# Patient Record
Sex: Male | Born: 1990 | Race: White | Hispanic: No | Marital: Single | Smoking: Former smoker
Health system: Southern US, Community
[De-identification: ages and names within clinical notes are randomized; demographics above are authoritative.]

## PROBLEM LIST (undated history)

## (undated) HISTORY — PX: APPENDECTOMY: SHX54

## (undated) HISTORY — PX: FRACTURE SURGERY: SHX138

## (undated) HISTORY — PX: SURGERY SCROTAL / TESTICULAR: SUR1316

---
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
## 2013-08-26 ENCOUNTER — Ambulatory Visit: Payer: Self-pay | Admitting: Physician Assistant

## 2013-09-09 ENCOUNTER — Ambulatory Visit: Payer: Self-pay | Admitting: Podiatry

## 2014-02-05 ENCOUNTER — Ambulatory Visit: Payer: Self-pay | Admitting: Physician Assistant

## 2014-09-15 ENCOUNTER — Ambulatory Visit: Payer: Self-pay | Admitting: Family Medicine

## 2018-09-03 ENCOUNTER — Other Ambulatory Visit: Payer: Self-pay

## 2018-09-03 ENCOUNTER — Encounter: Payer: Self-pay | Admitting: Emergency Medicine

## 2018-09-03 ENCOUNTER — Ambulatory Visit
Admission: RE | Admit: 2018-09-03 | Discharge: 2018-09-03 | Disposition: A | Discharge status: Home / Self Care | Payer: Self-pay | Source: Ambulatory Visit | Attending: Family Medicine | Admitting: Family Medicine

## 2018-09-03 ENCOUNTER — Ambulatory Visit
Admission: EM | Admit: 2018-09-03 | Discharge: 2018-09-03 | Disposition: A | Discharge status: Home / Self Care | Payer: Self-pay | Attending: Family Medicine | Admitting: Family Medicine

## 2018-09-03 DIAGNOSIS — S0990XA Unspecified injury of head, initial encounter: Secondary | ICD-10-CM

## 2018-09-03 DIAGNOSIS — Z23 Encounter for immunization: Secondary | ICD-10-CM

## 2018-09-03 DIAGNOSIS — S01311A Laceration without foreign body of right ear, initial encounter: Secondary | ICD-10-CM

## 2018-09-03 MED ORDER — TETANUS-DIPHTH-ACELL PERTUSSIS 5-2.5-18.5 LF-MCG/0.5 IM SUSP
0.5000 mL | Freq: Once | INTRAMUSCULAR | Status: AC
  Administered 2018-09-03: 0.5 mL via INTRAMUSCULAR

## 2018-09-03 MED ORDER — AMOXICILLIN-POT CLAVULANATE 875-125 MG PO TABS: 1.0000 | ORAL_TABLET | Freq: Two times a day (BID) | ORAL | 0 refills | Status: DC

## 2018-09-03 NOTE — ED Triage Notes (Signed)
Patient denies LOC and states last tetanus was 2014.
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
## 2018-09-03 NOTE — ED Notes (Signed)
Called pt per Dr. Judd Gaudier and let patient know his CT results were negative. Patient understood and said he would finish the antibiotics.
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
## 2019-10-01 ENCOUNTER — Other Ambulatory Visit: Payer: Self-pay

## 2019-10-01 ENCOUNTER — Ambulatory Visit
Admission: EM | Admit: 2019-10-01 | Discharge: 2019-10-01 | Disposition: A | Discharge status: Home / Self Care | Payer: HRSA Program | Attending: Urgent Care | Admitting: Urgent Care

## 2019-10-01 ENCOUNTER — Encounter: Payer: Self-pay | Admitting: Emergency Medicine

## 2019-10-01 DIAGNOSIS — Z7189 Other specified counseling: Secondary | ICD-10-CM | POA: Diagnosis present

## 2019-10-01 DIAGNOSIS — J302 Other seasonal allergic rhinitis: Secondary | ICD-10-CM | POA: Diagnosis present

## 2019-10-01 DIAGNOSIS — Z20822 Contact with and (suspected) exposure to covid-19: Secondary | ICD-10-CM | POA: Diagnosis present

## 2019-10-01 LAB — SARS CORONAVIRUS 2 (TAT 6-24 HRS): SARS Coronavirus 2: NEGATIVE

## 2019-10-01 NOTE — Discharge Instructions (Signed)
It was very nice seeing you today in clinic. Thank you for entrusting me with your care.   Symptoms are mild. Agree that it is likely a change in the weather/allergies. However, with your exposure to COVID, it cannot be ruled out without testing. Recommending symptomatic care with rest, hydration, and over the counter allergy medication (Zyrtec or Claritin).   You were tested for SARS-CoV-2 (novel coronavirus) today. Testing is being processed at the main campus of [facility] in [place], and have been taking 12-24 hours to come back. Current recommendations from the the CDC and [place] DHHS require that you remain out of work in order to quarantine at home until negative test results are have been received. In the event that your test results are positive, you will be contacted with further directives. These measures are being implemented out of an abundance of caution to prevent transmission and spread during the current SARS-CoV-2 pandemic.  Make arrangements to follow up with your regular doctor in 1 week for re-evaluation if not improving.  If your symptoms/condition worsens, please seek follow up care either here or in the ER. Please remember, our [facility] providers are "right here with you" when you need [place].   Again, it was my pleasure to take care of you today. Thank you for choosing our clinic. I hope that you start to feel better quickly.   Quentin Mulling, MSN, APRN, FNP-C, CEN Advanced Practice Provider [facility]

## 2019-10-01 NOTE — ED Provider Notes (Signed)
Alec Gray   Name: Alec Gray DOB: [birth year] MRN: [id number] CSN: [id number] PCP: Patient, No Pcp Per  Arrival date and time:  10/01/19 1027  Chief Complaint:  Covid Exposure  NOTE: Prior to seeing the patient today, I have reviewed the triage nursing documentation and vital signs. Clinical staff has updated patient's PMH/PSHx, current medication list, and drug allergies/intolerances to ensure comprehensive history available to assist in medical decision making.   History:   HPI: Alec Gray is a 30 y.o. male who presents today with complaints of fatigue, scratchy throat, PND, and rhinorrhea that started last night. Patient denies fevers. He denies any cough, shortness of breath, or wheezing. He denies that he has experienced any nausea, vomiting, diarrhea, or abdominal pain. He is eating and drinking well. Patient denies any perceived alterations to his sense of taste or smell. Patient presents out of concerns for his personal health after being exposed to someone who tested positive for SARS-CoV-2 (novel coronavirus) on 09/26/2019. He has not been tested for SARS-CoV-2 (novel coronavirus) in the past 14 days; last tested negative in 07/2019 per his report. Patient has not been vaccinated for influenza this season. Despite his symptoms, patient has not taken any over the counter interventions to help improve/relieve his reported symptoms at home.   History reviewed. No pertinent past medical history.  Past Surgical History:  Procedure Laterality Date  . APPENDECTOMY    . FRACTURE SURGERY Right    wrist  . SURGERY SCROTAL / TESTICULAR      Family History  Problem Relation Age of Onset  . Diabetes Mother   . Esophageal cancer Father     Social History   Tobacco Use  . Smoking status: Current Every Day Smoker    Packs/day: 0.50    Types: Cigarettes  . Smokeless tobacco: Never Used  Substance Use Topics  . Alcohol use: Yes    Comment: occasional  . Drug  use: Never    There are no problems to display for this patient.   Home Medications:    No outpatient medications have been marked as taking for the 10/01/19 encounter [facility] Encounter).    Allergies:   Patient has no known allergies.  Review of Systems (ROS):  Review of systems NEGATIVE unless otherwise noted in narrative H&P section.   Vital Signs: Today's Vitals   10/01/19 1046 10/01/19 1048 10/01/19 1051  BP:   127/80  Pulse:   70  Resp:   18  Temp:   98.2 F (36.8 C)  TempSrc:   Oral  SpO2:   99%  Weight:  170 lb (77.1 kg)   Height:  6' (1.829 m)   PainSc: 4       Physical Exam: Physical Exam  Constitutional: He is oriented to person, place, and time and well-developed, well-nourished, and in no distress.  HENT:  Head: Normocephalic and atraumatic.  Right Ear: Tympanic membrane normal.  Left Ear: Tympanic membrane normal.  Nose: Rhinorrhea present. No sinus tenderness.  Mouth/Throat: Uvula is midline and mucous membranes are normal. Posterior oropharyngeal erythema (mild with (+) clear PND) present. No oropharyngeal exudate or posterior oropharyngeal edema.  Eyes: Pupils are equal, round, and reactive to light. Conjunctivae are normal.  Cardiovascular: Normal rate, regular rhythm, normal heart sounds and intact distal pulses.  Pulmonary/Chest: Effort normal and breath sounds normal.  No cough noted in clinic. No SOB or increased WOB. No distress. Able to speak in complete sentences without difficulties. SPO2 99% on RA.  Musculoskeletal:     Cervical back: Normal range of motion and neck supple.  Neurological: He is alert and oriented to person, place, and time. Gait normal.  Skin: Skin is warm and dry. No rash noted. He is not diaphoretic.  Psychiatric: Mood, memory, affect and judgment normal.  Nursing note and vitals reviewed.   Urgent Care Treatments / Results:   Orders Placed This Encounter  Procedures  . SARS CORONAVIRUS 2 (TAT 6-24 HRS)  Nasopharyngeal Nasopharyngeal Swab    LABS: PLEASE NOTE: all labs that were ordered this encounter are listed, however only abnormal results are displayed. Labs Reviewed  SARS CORONAVIRUS 2 (TAT 6-24 HRS)    EKG: -None  RADIOLOGY: No results found.  PROCEDURES: Procedures  MEDICATIONS RECEIVED THIS VISIT: Medications - No data to display  PERTINENT CLINICAL COURSE NOTES/UPDATES:   Initial Impression / Assessment and Plan / Urgent Care Course:  Pertinent labs & imaging results that were available during my care of the patient were personally reviewed by me and considered in my medical decision making (see lab/imaging section of note for values and interpretations).  Alec Gray is a 29 y.o. male who presents to [facility] today with complaints of Covid Exposure  Patient overall well appearing and in no acute distress today in clinic. Presenting symptoms (see HPI) and exam as documented above. He presents with symptoms associated with SARS-CoV-2 (novel coronavirus). Discussed typical symptom constellation. Reviewed potential for infection and need for testing. Patient amenable to being tested. SARS-CoV-2 swab collected by certified clinical staff. Discussed variable turn around times associated with testing, as swabs are being processed at the main campus of [facility] in [place], and have been taking 12-24 hours to come back. He was advised to self quarantine, per [facility] DHHS guidelines, until negative results received. These measures are being implemented out of an abundance of caution to prevent transmission and spread during the current SARS-CoV-2 pandemic.  Exam reassuring. Doubt SARS-CoV-2. Suspect symptoms related to seasonal allergies. Discussed with patient that, until ruled out with confirmatory lab testing, SARS-CoV-2 remains part of the differential. His testing is pending at this time. Patient encouraged to start OTC allergy medication (loratadine or  cetirizine). Discussed supportive care measures at home during acute phase of illness. Patient to rest as much as possible. He was encouraged to ensure adequate hydration. Recommended warm salt water gargles, hard candies/lozenges, and hot tea with honey/lemon to help soothe the throat and reduce irritation. May use Tylenol and/or Ibuprofen as needed for pain/fever.    Current clinical condition warrants patient being out of work in order to quarantine while waiting for testing results. He was provided with the appropriate documentation to provide to his place of employment that will allow for him to RTW on 10/03/2019 with no restrictions. RTW is contingent on his SARS-CoV-2 test results being reviewed as negative.     Discussed follow up with primary care physician in 1 week for re-evaluation. I have reviewed the follow up and strict return precautions for any new or worsening symptoms. Patient is aware of symptoms that would be deemed urgent/emergent, and would thus require further evaluation either here or in the emergency department. At the time of discharge, he verbalized understanding and consent with the discharge plan as it was reviewed with him. All questions were fielded by provider and/or clinic staff prior to patient discharge.    Final Clinical Impressions / Urgent Care Diagnoses:   Final diagnoses:  Exposure to COVID-19 virus  Seasonal  allergies  Encounter for laboratory testing for COVID-19 virus  Advice given about COVID-19 virus infection    New Prescriptions:  [place] Controlled Substance Registry consulted? Not Applicable  No orders of the defined types were placed in this encounter.   Recommended Follow up Care:  Patient encouraged to follow up with the following provider within the specified time frame, or sooner as dictated by the severity of his symptoms. As always, he was instructed that for any urgent/emergent care needs, he should seek care either here or in the emergency  department for more immediate evaluation.  Follow-up Information    PCP In 1 week.   Why: General reassessment of symptoms if not improving        NOTE: This note was prepared using Scientist, clinical (histocompatibility and immunogenetics) along with smaller Lobbyist. Despite my best ability to proofread, there is the potential that transcriptional errors may still occur from this process, and are completely unintentional.    Verlee Monte, NP 10/01/19 2231

## 2019-10-01 NOTE — ED Triage Notes (Signed)
Pt states he was exposed to covid about 5 days ago. He is c/o scratchy throat, fatigue, runny nose. Started last night. He feels his symptoms are related to the time of year but wanted needs to be sure for his job. He will need a note for work as well.
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
## 2022-10-31 ENCOUNTER — Ambulatory Visit
Admission: EM | Admit: 2022-10-31 | Discharge: 2022-10-31 | Disposition: A | Discharge status: Home / Self Care | Payer: Self-pay

## 2022-10-31 ENCOUNTER — Ambulatory Visit: Admit: 2022-10-31 | Payer: Self-pay

## 2022-10-31 DIAGNOSIS — J069 Acute upper respiratory infection, unspecified: Secondary | ICD-10-CM

## 2022-10-31 NOTE — ED Triage Notes (Signed)
Pt c/o cough,congestion & fatigue x3 days. Denies any fevers.

## 2022-10-31 NOTE — ED Provider Notes (Signed)
[facility]    CSN: [id number] Arrival date & time: 10/31/22  1110      History   Chief Complaint Chief Complaint  Patient presents with   Nasal Congestion   Cough   Fatigue         HPI Alec Gray is a 32 y.o. male.   Patient presents for evaluation of chills, body aches, nasal congestion, rhinorrhea, sore throat and a nonproductive cough present for 3 days.  No known sick contacts prior.  Tolerating food and liquids.  Has attempted use of Mucinex.  Denies shortness of breath, wheezing.    History reviewed. No pertinent past medical history.  There are no problems to display for this patient.   Past Surgical History:  Procedure Laterality Date   APPENDECTOMY     FRACTURE SURGERY Right    wrist   SURGERY SCROTAL / TESTICULAR         Home Medications    Prior to Admission medications   Not on File    Family History Family History  Problem Relation Age of Onset   Diabetes Mother    Esophageal cancer Father     Social History Social History   Tobacco Use   Smoking status: Every Day    Packs/day: .5    Types: Cigarettes   Smokeless tobacco: Never  Vaping Use   Vaping Use: Never used  Substance Use Topics   Alcohol use: Yes    Comment: occasional   Drug use: Never     Allergies   Patient has no known allergies.   Review of Systems Review of Systems  Constitutional:  Positive for chills. Negative for activity change, appetite change, diaphoresis, fatigue, fever and unexpected weight change.  HENT:  Positive for congestion, rhinorrhea and sore throat. Negative for dental problem, drooling, ear discharge, ear pain, facial swelling, hearing loss, mouth sores, nosebleeds, postnasal drip, sinus pressure, sinus pain, sneezing, tinnitus, trouble swallowing and voice change.   Respiratory:  Positive for cough. Negative for apnea, choking, chest tightness, shortness of breath, wheezing and stridor.   Cardiovascular: Negative.    Gastrointestinal: Negative.   Musculoskeletal:  Positive for myalgias. Negative for arthralgias, back pain, gait problem, joint swelling, neck pain and neck stiffness.     Physical Exam Triage Vital Signs ED Triage Vitals  Enc Vitals Group     BP 10/31/22 1158 107/76     Pulse Rate 10/31/22 1158 75     Resp 10/31/22 1158 16     Temp 10/31/22 1158 98.8 F (37.1 C)     Temp Source 10/31/22 1158 Oral     SpO2 10/31/22 1158 96 %     Weight 10/31/22 1200 185 lb (83.9 kg)     Height 10/31/22 1200 6' (1.829 m)     Head Circumference --      Peak Flow --      Pain Score 10/31/22 1200 0     Pain Loc --      Pain Edu? --      Excl. in GC? --    No data found.  Updated Vital Signs BP 107/76 (BP Location: Left Arm)   Pulse 75   Temp 98.8 F (37.1 C) (Oral)   Resp 16   Ht 6' (1.829 m)   Wt 185 lb (83.9 kg)   SpO2 96%   BMI 25.09 kg/m   Visual Acuity Right Eye Distance:   Left Eye Distance:   Bilateral Distance:    Right  Eye Near:   Left Eye Near:    Bilateral Near:     Physical Exam Constitutional:      Appearance: Normal appearance.  HENT:     Head: Normocephalic.     Right Ear: Tympanic membrane, ear canal and external ear normal.     Left Ear: Tympanic membrane, ear canal and external ear normal.     Nose: Congestion and rhinorrhea present.     Mouth/Throat:     Mouth: Mucous membranes are moist.     Pharynx: Posterior oropharyngeal erythema present.  Eyes:     Extraocular Movements: Extraocular movements intact.  Cardiovascular:     Rate and Rhythm: Normal rate and regular rhythm.     Pulses: Normal pulses.     Heart sounds: Normal heart sounds.  Pulmonary:     Effort: Pulmonary effort is normal.     Breath sounds: Normal breath sounds.  Skin:    General: Skin is warm and dry.  Neurological:     Mental Status: He is alert and oriented to person, place, and time. Mental status is at baseline.  Psychiatric:        Mood and Affect: Mood normal.         Behavior: Behavior normal.      UC Treatments / Results  Labs (all labs ordered are listed, but only abnormal results are displayed) Labs Reviewed - No data to display  EKG   Radiology No results found.  Procedures Procedures (including critical care time)  Medications Ordered in UC Medications - No data to display  Initial Impression / Assessment and Plan / UC Course  I have reviewed the triage vital signs and the nursing notes.  Pertinent labs & imaging results that were available during my care of the patient were reviewed by me and considered in my medical decision making (see chart for details).  Viral URI with cough  Patient is in no signs of distress nor toxic appearing.  Vital signs are stable.  Low suspicion for pneumonia, pneumothorax or bronchitis and therefore will defer imaging.  Declined viral and strep testing.May use additional over-the-counter medications as needed for supportive care.  May follow-up with urgent care as needed if symptoms persist or worsen.  Note given.   Final Clinical Impressions(s) / UC Diagnoses   Final diagnoses:  None   Discharge Instructions   None    ED Prescriptions   None    PDMP not reviewed this encounter.   Valinda Hoar, NP 10/31/22 1230

## 2022-10-31 NOTE — Discharge Instructions (Signed)

## 2023-04-17 ENCOUNTER — Ambulatory Visit
Admission: EM | Admit: 2023-04-17 | Discharge: 2023-04-17 | Disposition: A | Discharge status: Home / Self Care | Payer: Self-pay | Attending: Emergency Medicine | Admitting: Emergency Medicine

## 2023-04-17 ENCOUNTER — Encounter: Payer: Self-pay | Admitting: Emergency Medicine

## 2023-04-17 DIAGNOSIS — Z20822 Contact with and (suspected) exposure to covid-19: Secondary | ICD-10-CM

## 2023-04-17 DIAGNOSIS — J0100 Acute maxillary sinusitis, unspecified: Secondary | ICD-10-CM

## 2023-04-17 DIAGNOSIS — J029 Acute pharyngitis, unspecified: Secondary | ICD-10-CM

## 2023-04-17 DIAGNOSIS — J069 Acute upper respiratory infection, unspecified: Secondary | ICD-10-CM

## 2023-04-17 LAB — GROUP A STREP BY PCR: Group A Strep by PCR: NOT DETECTED

## 2023-04-17 LAB — SARS CORONAVIRUS 2 BY RT PCR: SARS Coronavirus 2 by RT PCR: NEGATIVE

## 2023-04-17 MED ORDER — NAPROXEN 500 MG PO TABS: 500.0000 mg | ORAL_TABLET | Freq: Two times a day (BID) | ORAL | 0 refills | Status: AC

## 2023-04-17 MED ORDER — FLUTICASONE PROPIONATE 50 MCG/ACT NA SUSP: 2.0000 | Freq: Every day | NASAL | 0 refills | Status: AC

## 2023-04-17 NOTE — ED Provider Notes (Signed)
HPI  SUBJECTIVE:  Patient reports sore throat starting 3 days ago. Sx worse with swallowing.  Sx better with ibuprofen at 1000 mg.  Family member reports seeing "white spots" in his throat. Has felt feverish, but has no documented temperature.   No neck stiffness  No Cough, wheezing + nasal congestion, rhinorrhea-but states this has not changed, No sinus pain or pressure, postnasal drip No facial swelling, upper dental pain No Myalgias + Headache No Rash  No loss of taste or smell No shortness of breath or difficulty breathing No nausea, vomiting No diarrhea No abdominal pain     No Recent mono, flu, COVID exposure + Questionable strep exposure No reflux sxs No Allergy sxs  No Breathing difficulty, voice changes, sensation of throat swelling shut No Drooling No Trismus No abx in past month.  Did not get the COVID or flu vaccines No antipyretic in past 6 hrs  He has no past medical history PCP: None   History reviewed. No pertinent past medical history.  Past Surgical History:  Procedure Laterality Date   APPENDECTOMY     FRACTURE SURGERY Right    wrist   SURGERY SCROTAL / TESTICULAR      Family History  Problem Relation Age of Onset   Diabetes Mother    Esophageal cancer Father     Social History   Tobacco Use   Smoking status: Former    Current packs/day: 0.50    Types: Cigarettes   Smokeless tobacco: Never  Vaping Use   Vaping status: Every Day  Substance Use Topics   Alcohol use: Yes    Comment: occasional   Drug use: Never    No current facility-administered medications for this encounter.  Current Outpatient Medications:    fluticasone (FLONASE) 50 MCG/ACT nasal spray, Place 2 sprays into both nostrils daily., Disp: 16 g, Rfl: 0   naproxen (NAPROSYN) 500 MG tablet, Take 1 tablet (500 mg total) by mouth 2 (two) times daily., Disp: 20 tablet, Rfl: 0  No Known Allergies   ROS  As noted in HPI.   Physical Exam  BP 113/80 (BP  Location: Left Arm)   Pulse 73   Temp 98.2 F (36.8 C) (Oral)   Resp 16   SpO2 100%   Constitutional: Well developed, well nourished, no acute distress Eyes:  EOMI, conjunctiva normal bilaterally HENT: Normocephalic, atraumatic,mucus membranes moist.  Positive nasal congestion, erythematous, swollen turbinates.  Left maxillary sinus tenderness.  Erythematous oropharynx, slightly enlarged tonsils without exudates.  Positive cobblestoning.  No obvious postnasal drip.Marland Kitchen  Respiratory: Normal inspiratory effort Cardiovascular: Normal rate, no murmurs, rubs, gallops GI: nondistended, nontender. No appreciable splenomegaly skin: No rash, skin intact Lymph: + Anterior cervical LN.  No posterior cervical lymphadenopathy Musculoskeletal: no deformities Neurologic: Alert & oriented x 3, no focal neuro deficits Psychiatric: Speech and behavior appropriate.   ED Course   Medications - No data to display  Orders Placed This Encounter  Procedures   SARS Coronavirus 2 by RT PCR (hospital order, performed in [facility] hospital lab) *cepheid single result test* Anterior Nasal Swab    Standing Status:   Standing    Number of Occurrences:   1   Group A Strep by PCR    Standing Status:   Standing    Number of Occurrences:   1    Results for orders placed or performed during the hospital encounter of 04/17/23 (from the past 24 hour(s))  SARS Coronavirus 2 by RT PCR (hospital order, performed  in [facility] hospital lab) *cepheid single result test* Anterior Nasal Swab     Status: None   Collection Time: 04/17/23  6:17 PM   Specimen: Anterior Nasal Swab  Result Value Ref Range   SARS Coronavirus 2 by RT PCR NEGATIVE NEGATIVE  Group A Strep by PCR     Status: None   Collection Time: 04/17/23  6:17 PM   Specimen: Throat; Sterile Swab  Result Value Ref Range   Group A Strep by PCR NOT DETECTED NOT DETECTED   No results found.  ED Clinical Impression  1. Acute non-recurrent maxillary  sinusitis   2. Upper respiratory tract infection, unspecified type   3. Sore throat   4. Encounter for laboratory testing for COVID-19 virus      ED Assessment/Plan    COVID, strep PCR negative.  Patient has left maxillary sinus tenderness, but I suspect a viral sinusitis at this time.  He does not meet ISDA criteria for antibiotics.  Discussed this with patient and family member.  Home with Mucinex D, Flonase, saline nasal irrigation, Naprosyn/Tylenol, Benadryl/Maalox mixture.  Work note for 2 days.  Return here in 6 days if not better or sooner for double sickening and we can consider antibiotics at that time.  ER return precautions given.  Will provide primary care list for routine care.  Discussed labs,  MDM, plan and followup with patient and family member discussed sn/sx that should prompt return to the ED. they agree with plan.   Meds ordered this encounter  Medications   fluticasone (FLONASE) 50 MCG/ACT nasal spray    Sig: Place 2 sprays into both nostrils daily.    Dispense:  16 g    Refill:  0   naproxen (NAPROSYN) 500 MG tablet    Sig: Take 1 tablet (500 mg total) by mouth 2 (two) times daily.    Dispense:  20 tablet    Refill:  0     *This clinic note was created using Scientist, clinical (histocompatibility and immunogenetics). Therefore, there may be occasional mistakes despite careful proofreading.       Domenick Gong, MD 04/18/23 [phone]

## 2023-04-17 NOTE — ED Triage Notes (Signed)
Pt presents with a fever, cough, sore throat and bodyaches x 2 days.

## 2023-04-17 NOTE — Discharge Instructions (Addendum)
COVID, flu PCR is negative.  Mucinex D extra strength twice a day, Flonase, saline nasal irrigation with a NeilMed rinse and distilled water as often as you want, Naprosyn combined with with 1000 mg of Tylenol twice a day.    Some people find salt water gargles and  Traditional Medicinal's "Throat Coat" tea helpful. Take 5 mL of liquid Benadryl and 5 mL of Maalox. Mix it together, and then hold it in your mouth for as long as you can and then swallow. You may do this 4 times a day.  Honey and lemon dissolved in hot water can also be soothing.     Return here in 6 days if not better or sooner for double sickening and we can consider antibiotics at that time.  Here is a list of primary care providers who are taking new patients:  [facility] [place] Dr. Joseph Berkshire (sports medicine) Dr. Elizabeth Sauer [address] [place] [address] [phone]  [facility] [address] [place], [place] [address] [phone]  [facility] [place] [address]  [place] [address]  [phone]  [facility] [address] [place], [place] [address] [phone]  [facility] [address] [place]  [phone] [place], [place] [address]  Here are clinics/ other resources who will see you if you do not have insurance. Some have certain criteria that you must meet. Call them and find out what they are:  [facility]: [address] [place], [place] [address] Phone: [phone] Hours: First and Third Saturdays of each Month, 9 a.m. - 1 p.m.  [facility]: [address], [address] [place], [place] [address] Phone: [phone] Hours: Tuesday, 4 p.m. - 8 p.m. Thursday, 1 p.m. - 8 p.m. Wednesday, 9 a.m. - [facility] [address] [place], [place] [address] Phone: [phone] Pharmacy Phone Number: [phone] Dental Phone Number: [phone] [facility] Insurance Help: [phone]  Dental Hours: Monday -  Thursday, 8 a.m. - 6 p.m.  [facility] [address] [place], [place] [address] Phone: [phone] Pharmacy Phone Number: [phone] [facility] Insurance Help: [phone]  [facility] [address] [place] [place], [place] [address] Phone: [phone] Pharmacy Phone Number: [phone] [facility] Insurance Help: [phone]  [facility] [address] [place], [place] [address] Phone: [phone] [facility] Insurance Help: [phone]   [facility] [address], [place], [place] [address] Phone: [phone]  Go to www.goodrx.com  or www.costplusdrugs.com to look up your medications. This will give you a list of where you can find your prescriptions at the most affordable prices. Or ask the pharmacist what the cash price is, or if they have any other discount programs available to help make your medication more affordable. This can be less expensive than what you would pay with insurance.

## 2023-07-20 ENCOUNTER — Emergency Department
Admission: EM | Admit: 2023-07-20 | Discharge: 2023-07-20 | Disposition: A | Discharge status: Home / Self Care | Payer: Self-pay | Attending: Emergency Medicine | Admitting: Emergency Medicine

## 2023-07-20 ENCOUNTER — Emergency Department: Payer: Self-pay

## 2023-07-20 ENCOUNTER — Encounter: Payer: Self-pay | Admitting: Emergency Medicine

## 2023-07-20 ENCOUNTER — Other Ambulatory Visit: Payer: Self-pay

## 2023-07-20 DIAGNOSIS — B349 Viral infection, unspecified: Secondary | ICD-10-CM | POA: Insufficient documentation

## 2023-07-20 DIAGNOSIS — L209 Atopic dermatitis, unspecified: Secondary | ICD-10-CM

## 2023-07-20 DIAGNOSIS — Z20822 Contact with and (suspected) exposure to covid-19: Secondary | ICD-10-CM | POA: Insufficient documentation

## 2023-07-20 LAB — RESP PANEL BY RT-PCR (RSV, FLU A&B, COVID)  RVPGX2
Influenza A by PCR: NEGATIVE
Influenza B by PCR: NEGATIVE
Resp Syncytial Virus by PCR: NEGATIVE
SARS Coronavirus 2 by RT PCR: NEGATIVE

## 2023-07-20 MED ORDER — HYDROCORTISONE 2.5 % EX OINT: TOPICAL_OINTMENT | Freq: Two times a day (BID) | CUTANEOUS | 0 refills | Status: AC

## 2023-07-20 MED ORDER — PSEUDOEPH-BROMPHEN-DM 30-2-10 MG/5ML PO SYRP: 10.0000 mL | ORAL_SOLUTION | Freq: Four times a day (QID) | ORAL | 0 refills | Status: DC | PRN

## 2023-07-20 NOTE — Discharge Instructions (Addendum)
 Stay well-hydrated.  Take Tylenol and ibuprofen for body aches, fever, sore throat.  Follow-up with primary care if not improving over the week

## 2023-07-20 NOTE — ED Provider Notes (Signed)
 [facility] Provider Note    Event Date/Time   First MD Initiated Contact with Patient 07/20/23 [phone]     (approximate)   History   Cough   HPI  Alec Gray is a 33 y.o. male  with no significant past medical history and as listed in EMR presents to the emergency department for treatment and evaluation of sinus drainage, sinus pressure, cough, sore throat, headache, and bodyaches.  Symptoms started about a week ago.  Significant other here with similar symptoms.  No relief with over-the-counter medications.  Patient also complaining of a recurrent skin rash that occurs in areas around axilla and groin that seems to worsen when he works.       Physical Exam   Triage Vital Signs: ED Triage Vitals  Encounter Vitals Group     BP 07/20/23 0653 130/87     Systolic BP Percentile --      Diastolic BP Percentile --      Pulse Rate 07/20/23 0653 63     Resp 07/20/23 0653 16     Temp 07/20/23 0653 97.7 F (36.5 C)     Temp Source 07/20/23 0653 Oral     SpO2 07/20/23 0653 98 %     Weight 07/20/23 0644 190 lb (86.2 kg)     Height 07/20/23 0644 6' (1.829 m)     Head Circumference --      Peak Flow --      Pain Score 07/20/23 0644 6     Pain Loc --      Pain Education --      Exclude from Growth Chart --     Most recent vital signs: Vitals:   07/20/23 0653  BP: 130/87  Pulse: 63  Resp: 16  Temp: 97.7 F (36.5 C)  SpO2: 98%    General: Awake, no distress.  CV:  Good peripheral perfusion.  Resp:  Normal effort.  Breath sounds clear to auscultation Abd:  No distention.  Other:  Rough textured rash, mildly erythematous bilateral posterior axilla.   ED Results / Procedures / Treatments   Labs (all labs ordered are listed, but only abnormal results are displayed) Labs Reviewed  RESP PANEL BY RT-PCR (RSV, FLU A&B, COVID)  [id number]     EKG  Not indicated.   RADIOLOGY  Image and radiology report reviewed and interpreted by me.  Radiology report consistent with the same.  Not indicated.  PROCEDURES:  Critical Care performed: No  Procedures   MEDICATIONS ORDERED IN ED:  Medications - No data to display   IMPRESSION / MDM / ASSESSMENT AND PLAN / ED COURSE   I have reviewed the triage note.  Differential diagnosis includes, but is not limited to, Influenza, Covid, RSV, viral syndrome  Patient's presentation is most consistent with acute illness / injury with system symptoms.  33 year old male presenting to the emergency department for 1 week history of viral symptoms as described in the HPI.  Vital signs are stable.  Exam is reassuring.  Chest x-ray is negative for acute cardiopulmonary abnormality.  Viral panel is pending.  Significant other who is also patient is positive for influenza A.  Plan will be to treat him with Bromfed and have him continue Tylenol and ibuprofen.  Work excuse provided for the next few days as well.  He is to see primary care or return to the emergency department if symptoms or not improving over the week.      FINAL CLINICAL  IMPRESSION(S) / ED DIAGNOSES   Final diagnoses:  Acute viral syndrome     Rx / DC Orders   ED Discharge Orders          Ordered    brompheniramine-pseudoephedrine-DM 30-2-10 MG/5ML syrup  4 times daily PRN        07/20/23 0815             Note:  This document was prepared using Dragon voice recognition software and may include unintentional dictation errors.   Herlinda Kirk NOVAK, FNP 07/20/23 0840    Levander Slate, MD 07/20/23 [phone]

## 2023-07-20 NOTE — ED Triage Notes (Signed)
 Patient ambulatory to triage with steady gait, without difficulty or distress noted; pt reports x wk having sinus drainage/pressure, prod cough clear, sometimes green mucous

## 2023-11-17 ENCOUNTER — Ambulatory Visit
Admission: RE | Admit: 2023-11-17 | Discharge: 2023-11-17 | Disposition: A | Discharge status: Home / Self Care | Payer: Self-pay | Source: Ambulatory Visit | Attending: Family Medicine | Admitting: Family Medicine

## 2023-11-17 VITALS — BP 118/78 | HR 75 | Temp 97.8°F | Ht 72.0 in | Wt 190.0 lb

## 2023-11-17 DIAGNOSIS — R21 Rash and other nonspecific skin eruption: Secondary | ICD-10-CM

## 2023-11-17 MED ORDER — FLUCONAZOLE 150 MG PO TABS: 150.0000 mg | ORAL_TABLET | ORAL | 0 refills | Status: AC

## 2023-11-17 MED ORDER — NYSTATIN-TRIAMCINOLONE 100000-0.1 UNIT/GM-% EX OINT: 1.0000 | TOPICAL_OINTMENT | Freq: Two times a day (BID) | CUTANEOUS | 0 refills | Status: AC

## 2023-11-17 NOTE — ED Triage Notes (Signed)
 Pt c/o rash under arms and along inner thighs x3years  Pt is worried about possible Eczema

## 2023-11-17 NOTE — ED Provider Notes (Signed)
 [facility]    CSN: [id number] Arrival date & time: 11/17/23  1104      History   Chief Complaint Chief Complaint  Patient presents with   Rash    HPI Alec Gray is a 33 y.o. male.   HPI  Alec Gray presents for rash under his arm. Symptoms started a few years back and told it was fungal but he is concerned that he has eczema. Symptoms returned about 6 days ago.  Applied nothing to it thus far. Has rash around his groin also. He works outside and sweats a lot. .     There is been no new products including soaps and detergents.  No eye irritation, sore throat, difficulty breathing, nausea, vomiting or diarrhea.  Denies belly pain, joint pain and fever.  There has been no medication changes or new supplements.  Denies any new foods or drinks.      History reviewed. No pertinent past medical history.  There are no active problems to display for this patient.   Past Surgical History:  Procedure Laterality Date   APPENDECTOMY     FRACTURE SURGERY Right    wrist   SURGERY SCROTAL / TESTICULAR         Home Medications    Prior to Admission medications   Medication Sig Start Date End Date Taking? Authorizing Provider  fluconazole (DIFLUCAN) 150 MG tablet Take 1 tablet (150 mg total) by mouth once a week. 11/17/23  Yes Rieley Khalsa, DO  nystatin-triamcinolone ointment (MYCOLOG) Apply 1 Application topically 2 (two) times daily. 11/17/23  Yes Vaidehi Braddy, DO  fluticasone  (FLONASE ) 50 MCG/ACT nasal spray Place 2 sprays into both nostrils daily. 04/17/23   Ethlyn Herd, MD  hydrocortisone  2.5 % ointment Apply topically 2 (two) times daily. 07/20/23   Triplett, Davene Ernst B, FNP  naproxen  (NAPROSYN ) 500 MG tablet Take 1 tablet (500 mg total) by mouth 2 (two) times daily. 04/17/23   Ethlyn Herd, MD    Family History Family History  Problem Relation Age of Onset   Diabetes Mother    Esophageal cancer Father     Social History Social History    Tobacco Use   Smoking status: Former    Current packs/day: 0.50    Types: Cigarettes   Smokeless tobacco: Never  Vaping Use   Vaping status: Every Day  Substance Use Topics   Alcohol use: Yes    Comment: occasional   Drug use: Never     Allergies   Patient has no known allergies.   Review of Systems Review of Systems :negative unless otherwise stated in HPI.      Physical Exam Triage Vital Signs ED Triage Vitals  Encounter Vitals Group     BP 11/17/23 1110 118/78     Systolic BP Percentile --      Diastolic BP Percentile --      Pulse Rate 11/17/23 1110 75     Resp --      Temp 11/17/23 1110 97.8 F (36.6 C)     Temp Source 11/17/23 1110 Oral     SpO2 11/17/23 1110 97 %     Weight 11/17/23 1112 190 lb (86.2 kg)     Height 11/17/23 1112 6' (1.829 m)     Head Circumference --      Peak Flow --      Pain Score 11/17/23 1111 6     Pain Loc --      Pain Education --  Exclude from Growth Chart --    No data found.  Updated Vital Signs BP 118/78 (BP Location: Left Arm)   Pulse 75   Temp 97.8 F (36.6 C) (Oral)   Ht 6' (1.829 m)   Wt 86.2 kg   SpO2 97%   BMI 25.77 kg/m   Visual Acuity Right Eye Distance:   Left Eye Distance:   Bilateral Distance:    Right Eye Near:   Left Eye Near:    Bilateral Near:     Physical Exam  GEN: alert, well appearing male, in no acute distress  RESP: no increased work of breathing, NEURO: alert, moves all extremities appropriately SKIN: warm and dry; erythematous patches with scaling on posterior axilla, left groin with similar erythematous patches with scaling and satellite lesions     UC Treatments / Results  Labs (all labs ordered are listed, but only abnormal results are displayed) Labs Reviewed - No data to display  EKG   Radiology No results found.  Procedures Procedures (including critical care time)  Medications Ordered in UC Medications - No data to display  Initial Impression /  Assessment and Plan / UC Course  I have reviewed the triage vital signs and the nursing notes.  Pertinent labs & imaging results that were available during my care of the patient were reviewed by me and considered in my medical decision making (see chart for details).     Patient is a 33 y.o. male who presents for rash.  Overall, patient is well-appearing and well-hydrated.  Vital signs stable.  Alec Gray is afebrile.  Exam concerning for fungal infection.  Treat with Diflucan weekly for 4 weeks and Mycolog ointment. Pt to make sure he is using an deodorant with an antiperspirant. No sign of bacterial infection to suggest antibiotics at this time.  Not likely viral exanthem.    Reviewed expectations regarding course of current medical issues.  All questions asked were answered.  Outlined signs and symptoms indicating need for more acute intervention. Patient verbalized understanding. After Visit Summary given.   Final Clinical Impressions(s) / UC Diagnoses   Final diagnoses:  Rash     Discharge Instructions      Stop by the pharmacy to pick up your prescriptions.  Follow up with your primary care provider or return to the urgent care, if not improving.       ED Prescriptions     Medication Sig Dispense Auth. Provider   nystatin-triamcinolone ointment (MYCOLOG) Apply 1 Application topically 2 (two) times daily. 30 g Aalyssa Elderkin, DO   fluconazole (DIFLUCAN) 150 MG tablet Take 1 tablet (150 mg total) by mouth once a week. 4 tablet Marielis Samara, DO      PDMP not reviewed this encounter.              Fidel Huddle, DO 11/17/23 1142

## 2023-11-17 NOTE — Discharge Instructions (Signed)
 Stop by the pharmacy to pick up your prescriptions.  Follow up with your primary care provider or return to the urgent care, if not improving.

## 2024-04-19 ENCOUNTER — Ambulatory Visit
Admission: EM | Admit: 2024-04-19 | Discharge: 2024-04-19 | Disposition: A | Discharge status: Home / Self Care | Payer: Self-pay | Attending: Emergency Medicine | Admitting: Emergency Medicine

## 2024-04-19 DIAGNOSIS — J069 Acute upper respiratory infection, unspecified: Secondary | ICD-10-CM

## 2024-04-19 MED ORDER — BENZONATATE 100 MG PO CAPS: 200.0000 mg | ORAL_CAPSULE | Freq: Three times a day (TID) | ORAL | 0 refills | Status: AC

## 2024-04-19 MED ORDER — IPRATROPIUM BROMIDE 0.06 % NA SOLN: 2.0000 | Freq: Four times a day (QID) | NASAL | 12 refills | Status: AC

## 2024-04-19 MED ORDER — PROMETHAZINE-DM 6.25-15 MG/5ML PO SYRP: 5.0000 mL | ORAL_SOLUTION | Freq: Four times a day (QID) | ORAL | 0 refills | Status: AC | PRN

## 2024-04-19 NOTE — ED Triage Notes (Signed)
 Patient to Urgent Care with complaints of productive cough/ green nasal congestion. Denies any known fevers.   Symptoms x3-4 days.   Using mucinex  Requests a doctor's note.

## 2024-04-19 NOTE — ED Provider Notes (Signed)
 [facility]    CSN: [id number] Arrival date & time: 04/19/24  1514      History   Chief Complaint Chief Complaint  Patient presents with   Nasal Congestion    HPI Alec Gray is a 33 y.o. male.   HPI  33 year old male with no significant past medical history presents for evaluation of 4 days worth of respiratory symptoms to include nasal congestion with green nasal discharge, ear pain, sore throat, productive cough, short of breath, and wheezing.  He denies any fever.  No known sick contacts.  No travel out of state or country.  History reviewed. No pertinent past medical history.  There are no active problems to display for this patient.   Past Surgical History:  Procedure Laterality Date   APPENDECTOMY     FRACTURE SURGERY Right    wrist   SURGERY SCROTAL / TESTICULAR         Home Medications    Prior to Admission medications   Medication Sig Start Date End Date Taking? Authorizing Provider  benzonatate (TESSALON) 100 MG capsule Take 2 capsules (200 mg total) by mouth every 8 (eight) hours. 04/19/24  Yes Bernardino Ditch, NP  ipratropium (ATROVENT) 0.06 % nasal spray Place 2 sprays into both nostrils 4 (four) times daily. 04/19/24  Yes Bernardino Ditch, NP  promethazine-dextromethorphan (PROMETHAZINE-DM) 6.25-15 MG/5ML syrup Take 5 mLs by mouth 4 (four) times daily as needed. 04/19/24  Yes Bernardino Ditch, NP  fluconazole  (DIFLUCAN ) 150 MG tablet Take 1 tablet (150 mg total) by mouth once a week. 11/17/23   Brimage, Vondra, DO  fluticasone  (FLONASE ) 50 MCG/ACT nasal spray Place 2 sprays into both nostrils daily. 04/17/23   Van Knee, MD  hydrocortisone  2.5 % ointment Apply topically 2 (two) times daily. 07/20/23   Triplett, Kirk B, FNP  naproxen  (NAPROSYN ) 500 MG tablet Take 1 tablet (500 mg total) by mouth 2 (two) times daily. 04/17/23   Van Knee, MD  nystatin -triamcinolone  ointment (MYCOLOG) Apply 1 Application topically 2 (two) times daily.  11/17/23   Brimage, Vondra, DO    Family History Family History  Problem Relation Age of Onset   Diabetes Mother    Esophageal cancer Father     Social History Social History   Tobacco Use   Smoking status: Former    Current packs/day: 0.50    Types: Cigarettes   Smokeless tobacco: Never  Vaping Use   Vaping status: Every Day  Substance Use Topics   Alcohol use: Yes    Comment: occasional   Drug use: Never     Allergies   Patient has no known allergies.   Review of Systems Review of Systems  Constitutional:  Negative for fever.  HENT:  Positive for congestion, ear pain, rhinorrhea and sore throat.   Respiratory:  Positive for cough, shortness of breath and wheezing.      Physical Exam Triage Vital Signs ED Triage Vitals  Encounter Vitals Group     BP 04/19/24 1611 121/79     Girls Systolic BP Percentile --      Girls Diastolic BP Percentile --      Boys Systolic BP Percentile --      Boys Diastolic BP Percentile --      Pulse Rate 04/19/24 1611 70     Resp 04/19/24 1611 18     Temp 04/19/24 1611 98.2 F (36.8 C)     Temp src --      SpO2 04/19/24 1611 100 %  Weight --      Height --      Head Circumference --      Peak Flow --      Pain Score 04/19/24 1610 3     Pain Loc --      Pain Education --      Exclude from Growth Chart --    No data found.  Updated Vital Signs BP 121/79   Pulse 70   Temp 98.2 F (36.8 C)   Resp 18   SpO2 100%   Visual Acuity Right Eye Distance:   Left Eye Distance:   Bilateral Distance:    Right Eye Near:   Left Eye Near:    Bilateral Near:     Physical Exam Vitals and nursing note reviewed.  Constitutional:      Appearance: Normal appearance. He is not ill-appearing.  HENT:     Head: Normocephalic and atraumatic.     Right Ear: Tympanic membrane, ear canal and external ear normal. There is no impacted cerumen.     Left Ear: Tympanic membrane, ear canal and external ear normal. There is no impacted  cerumen.     Nose: Congestion and rhinorrhea present.     Comments: Mucosa is edematous and erythematous with scant clear discharge in both nares.    Mouth/Throat:     Mouth: Mucous membranes are moist.     Pharynx: Oropharynx is clear. Posterior oropharyngeal erythema present. No oropharyngeal exudate.     Comments: Tonsillar pillars are unremarkable.  Posterior oropharynx demonstrates moderate edema with clear postnasal drip. Cardiovascular:     Rate and Rhythm: Normal rate and regular rhythm.     Pulses: Normal pulses.     Heart sounds: Normal heart sounds. No murmur heard.    No friction rub. No gallop.  Pulmonary:     Effort: Pulmonary effort is normal.     Breath sounds: Normal breath sounds. No wheezing, rhonchi or rales.  Musculoskeletal:     Cervical back: Normal range of motion and neck supple. No tenderness.  Lymphadenopathy:     Cervical: No cervical adenopathy.  Skin:    General: Skin is warm and dry.     Capillary Refill: Capillary refill takes less than 2 seconds.     Findings: No rash.  Neurological:     General: No focal deficit present.     Mental Status: He is alert and oriented to person, place, and time.      UC Treatments / Results  Labs (all labs ordered are listed, but only abnormal results are displayed) Labs Reviewed - No data to display  EKG   Radiology No results found.  Procedures Procedures (including critical care time)  Medications Ordered in UC Medications - No data to display  Initial Impression / Assessment and Plan / UC Course  I have reviewed the triage vital signs and the nursing notes.  Pertinent labs & imaging results that were available during my care of the patient were reviewed by me and considered in my medical decision making (see chart for details).   Patient is a nontoxic-appearing 33 year old male presenting for evaluation of 4 days worth of respiratory symptoms as outlined in HPI above.  He does report that he has  had mild shortness of breath and wheezing, though he is able to speak in full sentences without dyspnea or tachypnea.  Respiratory rate at triage was 18 with a 100% room air oxygen saturation.  He is afebrile with an oral temp  of 98.2.  Differential diagnose include COVID, influenza, viral respiratory illness.  He is outside the therapeutic window for antiviral so I will not test for influenza at this time and he is declining COVID testing.  I will discharge him home with a diagnosis of viral URI with a cough with prescriptions for Atrovent nasal spray for his congestion and Tessalon Perles and Promethazine DM cough syrup for cough and congestion.  Return precautions reviewed.  Work note provided.   Final Clinical Impressions(s) / UC Diagnoses   Final diagnoses:  Viral URI with cough     Discharge Instructions      As we discussed, your exam is consistent with a viral respiratory infection.  Use over-the-counter Tylenol and or ibuprofen according to the package instructions as needed for any pain or if you develop a fever.  Use the Atrovent nasal spray, 2 squirts in each nostril every 6 hours, as needed for runny nose and postnasal drip.  Use the Tessalon Perles every 8 hours during the day.  Take them with a small sip of water.  They may give you some numbness to the base of your tongue or a metallic taste in your mouth, this is normal.  Use the Promethazine DM cough syrup at bedtime for cough and congestion.  It will make you drowsy so do not take it during the day.  Return for reevaluation or see your primary care provider for any new or worsening symptoms.      ED Prescriptions     Medication Sig Dispense Auth. Provider   benzonatate (TESSALON) 100 MG capsule Take 2 capsules (200 mg total) by mouth every 8 (eight) hours. 21 capsule Bernardino Ditch, NP   ipratropium (ATROVENT) 0.06 % nasal spray Place 2 sprays into both nostrils 4 (four) times daily. 15 mL Bernardino Ditch, NP    promethazine-dextromethorphan (PROMETHAZINE-DM) 6.25-15 MG/5ML syrup Take 5 mLs by mouth 4 (four) times daily as needed. 118 mL Bernardino Ditch, NP      PDMP not reviewed this encounter.   Bernardino Ditch, NP 04/19/24 [phone]

## 2024-04-19 NOTE — Discharge Instructions (Signed)
 As we discussed, your exam is consistent with a viral respiratory infection.  Use over-the-counter Tylenol and or ibuprofen according to the package instructions as needed for any pain or if you develop a fever.  Use the Atrovent nasal spray, 2 squirts in each nostril every 6 hours, as needed for runny nose and postnasal drip.  Use the Tessalon Perles every 8 hours during the day.  Take them with a small sip of water.  They may give you some numbness to the base of your tongue or a metallic taste in your mouth, this is normal.  Use the Promethazine DM cough syrup at bedtime for cough and congestion.  It will make you drowsy so do not take it during the day.  Return for reevaluation or see your primary care provider for any new or worsening symptoms.

## 2024-07-24 ENCOUNTER — Ambulatory Visit: Payer: Self-pay
